# Patient Record
Sex: Male | Born: 1985 | Race: White | Hispanic: No | Marital: Single | Smoking: Never smoker
Health system: Southern US, Community
[De-identification: ages and names within clinical notes are randomized; demographics above are authoritative.]

---
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
## 2011-03-10 ENCOUNTER — Other Ambulatory Visit: Payer: Self-pay | Admitting: Emergency Medicine

## 2011-03-11 ENCOUNTER — Ambulatory Visit
Admission: RE | Admit: 2011-03-11 | Discharge: 2011-03-11 | Disposition: A | Discharge status: Home / Self Care | Payer: BC Managed Care – PPO | Source: Ambulatory Visit | Attending: Emergency Medicine | Admitting: Emergency Medicine

## 2012-10-03 ENCOUNTER — Ambulatory Visit (INDEPENDENT_AMBULATORY_CARE_PROVIDER_SITE_OTHER): Payer: BC Managed Care – PPO | Admitting: Emergency Medicine

## 2012-10-03 VITALS — BP 100/64 | HR 73 | Temp 99.1°F | Resp 12 | Ht 67.5 in | Wt 152.0 lb

## 2012-10-03 DIAGNOSIS — R509 Fever, unspecified: Secondary | ICD-10-CM

## 2012-10-03 DIAGNOSIS — R059 Cough, unspecified: Secondary | ICD-10-CM

## 2012-10-03 DIAGNOSIS — J020 Streptococcal pharyngitis: Secondary | ICD-10-CM

## 2012-10-03 DIAGNOSIS — J029 Acute pharyngitis, unspecified: Secondary | ICD-10-CM

## 2012-10-03 DIAGNOSIS — R05 Cough: Secondary | ICD-10-CM

## 2012-10-03 LAB — POCT RAPID STREP A (OFFICE): Rapid Strep A Screen: NEGATIVE

## 2012-10-03 LAB — POCT INFLUENZA A/B
Influenza A, POC: NEGATIVE
Influenza B, POC: NEGATIVE

## 2012-10-03 MED ORDER — AZITHROMYCIN 250 MG PO TABS: ORAL_TABLET | ORAL | Status: DC

## 2012-10-03 MED ORDER — BENZONATATE 200 MG PO CAPS: 200.0000 mg | ORAL_CAPSULE | Freq: Two times a day (BID) | ORAL | Status: DC | PRN

## 2012-10-03 MED ORDER — ALBUTEROL SULFATE HFA 108 (90 BASE) MCG/ACT IN AERS: 2.0000 | INHALATION_SPRAY | RESPIRATORY_TRACT | Status: DC | PRN

## 2012-10-03 NOTE — Progress Notes (Signed)
  Subjective:    Patient ID: Jeffrey Malone, male    DOB: 06/07/1985, 27 y.o.   MRN: [id number]  HPI   27 yo male presents with sore throat x 2 weeks.   Pt states he went for a run and a throat tickle started.  Progressively throat became more sore.  Voice changes since Friday.  Productive cough with cloudy white product.       Review of Systems     Objective:   Physical Exam patient is alert cooperative in no distress. His neck is supple. TMs are clear. Nose is normal . Patient has had his tonsils removed. There is minimal redness. Chest exam is clear to auscultation and percussion. Results for orders placed in visit on 10/03/12  POCT RAPID STREP A (OFFICE)      Result Value Range   Rapid Strep A Screen Negative  Negative  POCT INFLUENZA A/B      Result Value Range   Influenza A, POC Negative     Influenza B, POC Negative           Assessment & Plan:  Patient placed on Z-Pak, Tessalon Perles, and albuterol inhaler if needed. recheck in 2-3 days if not improved

## 2012-10-03 NOTE — Patient Instructions (Signed)
Cough, Adult   A cough is a reflex that helps clear your throat and airways. It can help heal the body or may be a reaction to an irritated airway. A cough may only last 2 or 3 weeks (acute) or may last more than 8 weeks (chronic).   CAUSES  Acute cough:   Viral or bacterial infections.  Chronic cough:   Infections.   Allergies.   Asthma.   Post-nasal drip.   Smoking.   Heartburn or acid reflux.   Some medicines.   Chronic lung problems (COPD).   Cancer.  SYMPTOMS    Cough.   Fever.   Chest pain.   Increased breathing rate.   High-pitched whistling sound when breathing (wheezing).   Colored mucus that you cough up (sputum).  TREATMENT    A bacterial cough may be treated with antibiotic medicine.   A viral cough must run its course and will not respond to antibiotics.   Your caregiver may recommend other treatments if you have a chronic cough.  HOME CARE INSTRUCTIONS    Only take over-the-counter or prescription medicines for pain, discomfort, or fever as directed by your caregiver. Use cough suppressants only as directed by your caregiver.   Use a cold steam vaporizer or humidifier in your bedroom or home to help loosen secretions.   Sleep in a semi-upright position if your cough is worse at night.   Rest as needed.   Stop smoking if you smoke.  SEEK IMMEDIATE MEDICAL CARE IF:    You have pus in your sputum.   Your cough starts to worsen.   You cannot control your cough with suppressants and are losing sleep.   You begin coughing up blood.   You have difficulty breathing.   You develop pain which is getting worse or is uncontrolled with medicine.   You have a fever.  MAKE SURE YOU:    Understand these instructions.   Will watch your condition.   Will get help right away if you are not doing well or get worse.  Document Released: 11/15/2010 Document Revised: 08/11/2011 Document Reviewed: 11/15/2010  Exit[employer] Patient Information 2013 [employer]  Laryngitis  At the top of your  windpipe is your voice box. It is the source of your voice. Inside your voice box are 2 bands of muscles called vocal cords. When you breathe, your vocal cords are relaxed and open so that air can get into the lungs. When you decide to say something, these cords come together and vibrate. The sound from these vibrations goes into your throat and comes out through your mouth as sound.  Laryngitis is an inflammation of the vocal cords that causes hoarseness, cough, loss of voice, sore throat, and dry throat. Laryngitis can be temporary (acute) or long-term (chronic). Most cases of acute laryngitis improve with time.Chronic laryngitis lasts for more than 3 weeks.  CAUSES  Laryngitis can often be related to excessive smoking, talking, or yelling, as well as inhalation of toxic fumes and allergies. Acute laryngitis is usually caused by a viral infection, vocal strain, measles or mumps, or bacterial infections. Chronic laryngitis is usually caused by vocal cord strain, vocal cord injury, postnasal drip, growths on the vocal cords, or acid reflux.  SYMPTOMS    Cough.   Sore throat.   Dry throat.  RISK FACTORS   Respiratory infections.   Exposure to irritating substances, such as cigarette smoke, excessive amounts of alcohol, stomach acids, and workplace chemicals.   Voice trauma,   such as vocal cord injury from shouting or speaking too loud.  DIAGNOSIS   Your cargiver will perform a physical exam. During the physical exam, your caregiver will examine your throat. The most common sign of laryngitis is hoarseness. Laryngoscopy may be necessary to confirm the diagnosis of this condition. This procedure allows your caregiver to look into the larynx.  HOME CARE INSTRUCTIONS   Drink enough fluids to keep your urine clear or pale yellow.   Rest until you no longer have symptoms or as directed by your caregiver.   Breathe in moist air.   Take all medicine as directed by your caregiver.   Do not smoke.   Talk as little  as possible (this includes whispering).   Write on paper instead of talking until your voice is back to normal.   Follow up with your caregiver if your condition has not improved after 10 days.  SEEK MEDICAL CARE IF:    You have trouble breathing.   You cough up blood.   You have persistent fever.   You have increasing pain.   You have difficulty swallowing.  MAKE SURE YOU:   Understand these instructions.   Will watch your condition.   Will get help right away if you are not doing well or get worse.  Document Released: 05/19/2005 Document Revised: 08/11/2011 Document Reviewed: 07/25/2010  [employer] Patient Information 2013 [employer]

## 2012-11-19 ENCOUNTER — Ambulatory Visit (INDEPENDENT_AMBULATORY_CARE_PROVIDER_SITE_OTHER): Payer: BC Managed Care – PPO | Admitting: Internal Medicine

## 2012-11-19 VITALS — BP 120/78 | HR 86 | Temp 98.0°F | Resp 16 | Ht 67.0 in | Wt 156.0 lb

## 2012-11-19 DIAGNOSIS — R05 Cough: Secondary | ICD-10-CM

## 2012-11-19 DIAGNOSIS — J019 Acute sinusitis, unspecified: Secondary | ICD-10-CM

## 2012-11-19 MED ORDER — PREDNISONE 20 MG PO TABS: ORAL_TABLET | ORAL | Status: DC

## 2012-11-19 MED ORDER — AMOXICILLIN 500 MG PO CAPS: 1000.0000 mg | ORAL_CAPSULE | Freq: Two times a day (BID) | ORAL | Status: AC

## 2012-11-19 NOTE — Progress Notes (Signed)
  Subjective:    Patient ID: Jeffrey Malone, male    DOB: 12-08-1985, 27 y.o.   MRN: [id number]  HPI 27 YO Male patient comes in today with the following complaints started with congestion and cough about two weeks ago. Cough is affecting sleep, comes with rest or activity. Nose has been congested. Throat has been dry and irritated. No fever. 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 predniSONE (DELTASONE) 20 MG tablet    Sig: 3/3/2/2/1/1 single daily dose for 6 days    Dispense:  12 tablet    Refill:  0  . amoxicillin (AMOXIL) 500 MG capsule    Sig: Take 2 capsules (1,000 mg total) by mouth 2 (two) times daily.    Dispense:  40 capsule    Refill:  0

## 2012-12-06 ENCOUNTER — Encounter: Payer: Self-pay | Admitting: Family Medicine

## 2012-12-06 ENCOUNTER — Ambulatory Visit (INDEPENDENT_AMBULATORY_CARE_PROVIDER_SITE_OTHER): Payer: BC Managed Care – PPO | Admitting: Family Medicine

## 2012-12-06 VITALS — BP 118/72 | HR 60 | Temp 98.1°F | Resp 16 | Ht 67.5 in | Wt 153.2 lb

## 2012-12-06 DIAGNOSIS — K130 Diseases of lips: Secondary | ICD-10-CM

## 2012-12-06 MED ORDER — CLOTRIMAZOLE 1 % EX CREA: TOPICAL_CREAM | Freq: Two times a day (BID) | CUTANEOUS | Status: DC

## 2012-12-06 NOTE — Patient Instructions (Signed)
Apply the antifungal cream twice per day for next 2 weeks. Apply vaseline to area at other times. Return to the clinic or go to the nearest emergency room if any of your symptoms worsen or new symptoms occur.

## 2012-12-06 NOTE — Progress Notes (Signed)
  Subjective:    Patient ID: Jeffrey Malone, male    DOB: [birth year], 27 y.o.   MRN: [id number]  HPI Jeffrey Malone is a 27 y.o. male  "[name]" Treated with prednisone taper and amox for sinus infection 11/19/12.  Had been on Zpak for bronchitis/cough 10/03/12. Feels better in regards to cough.congestion and sinus symptoms last week, but now with rash in corners of mouth. No fever. Hx of cold sores, but usually on inside or bottom of lip.   Current rash started 3-4 days ago. Itchy. No pain, no mouth sores.  Wears retainers.  Has tried to apply Abbreva past few days.   Review of Systems  Constitutional: Negative for fever and chills.  HENT: Negative for sore throat, mouth sores, trouble swallowing and voice change.   Respiratory: Negative for cough and shortness of breath.        Objective:   Physical Exam  Vitals reviewed. Constitutional: He is oriented to person, place, and time. He appears well-developed and well-nourished.  HENT:  Head: Normocephalic and atraumatic.  Right Ear: Tympanic membrane, external ear and ear canal normal.  Left Ear: Tympanic membrane, external ear and ear canal normal.  Nose: No rhinorrhea.  Mouth/Throat: Uvula is midline, oropharynx is clear and moist and mucous membranes are normal. No oropharyngeal exudate or posterior oropharyngeal erythema.    Eyes: Conjunctivae are normal. Pupils are equal, round, and reactive to light.  Neck: Neck supple.  Cardiovascular: Normal rate, regular rhythm, normal heart sounds and intact distal pulses.   No murmur heard. Pulmonary/Chest: Effort normal and breath sounds normal. He has no wheezes. He has no rhonchi. He has no rales.  Abdominal: Soft. There is no tenderness.  Lymphadenopathy:    He has no cervical adenopathy.  Neurological: He is alert and oriented to person, place, and time.  Skin: Skin is warm and dry. No rash noted.  Psychiatric: He has a normal mood and affect. His behavior is normal.     Results for orders placed in visit on 12/06/12  POCT SKIN KOH      Result Value Range   Skin KOH, POC Negative         Assessment & Plan:  Jeffrey Malone is a 27 y.o. male Angular cheilitis - Plan: POCT Skin KOH, clotrimazole (LOTRIMIN) 1 % cream  Possible irritant cheilitis, but with recent abx - possible fungal component. Start clotrimazole, vaseline as barrier, rtc precautions. Does not appear bacterial at this point, but rtc precautions discussed.   Meds ordered this encounter  Medications  . clotrimazole (LOTRIMIN) 1 % cream    Sig: Apply topically 2 (two) times daily.    Dispense:  30 g    Refill:  0   Patient Instructions  Apply the antifungal cream twice per day for next 2 weeks. Apply vaseline to area at other times. Return to the clinic or go to the nearest emergency room if any of your symptoms worsen or new symptoms occur.

## 2013-01-21 IMAGING — CT CT ABD-PELV W/O CM
2 of 4 series · 17 of 46 positions shown, 19 images · non-contrast
Comparison: None.

CLINICAL DATA: Gross hematuria.

CT ABDOMEN AND PELVIS WITHOUT CONTRAST
TECHNIQUE: Multidetector CT imaging of the abdomen and pelvis was
performed following the standard protocol without intravenous
contrast.

[Series 2: renal stone w/o · axial · non-contrast · 0.70mm/px · z∈[-408,+2]mm · 14 of 87 slices shown, 16 images]
[im 4/87  soft-tissue]
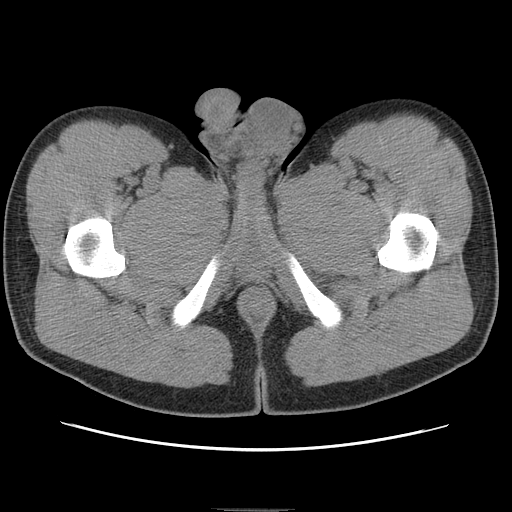
[im 4/87  bone]
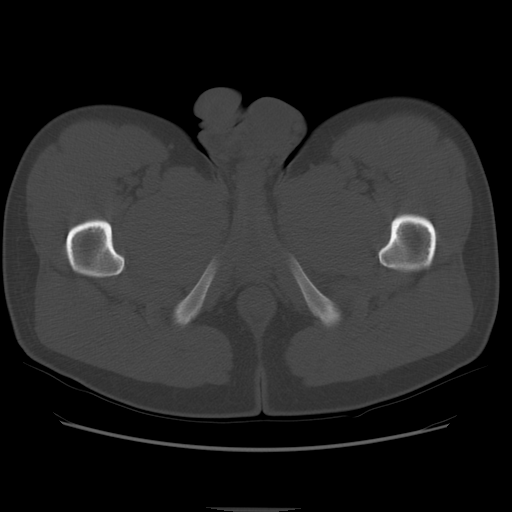
[im 11/87  soft-tissue]
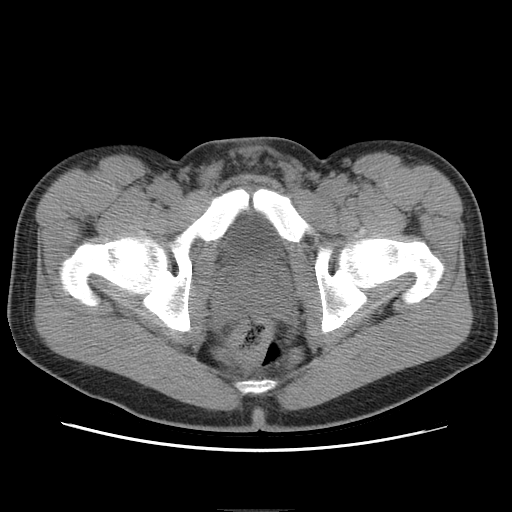
[im 18/87  soft-tissue]
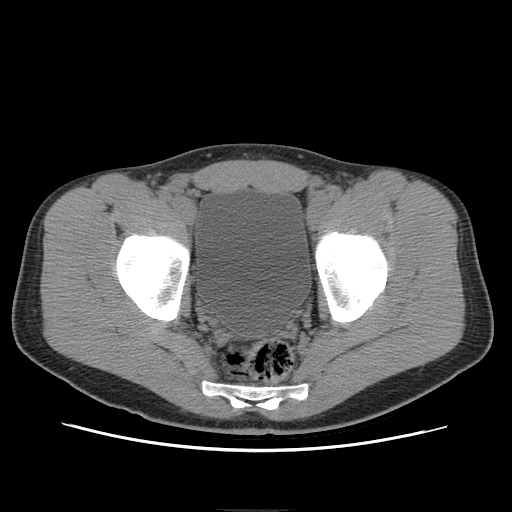
[im 22/87  soft-tissue]
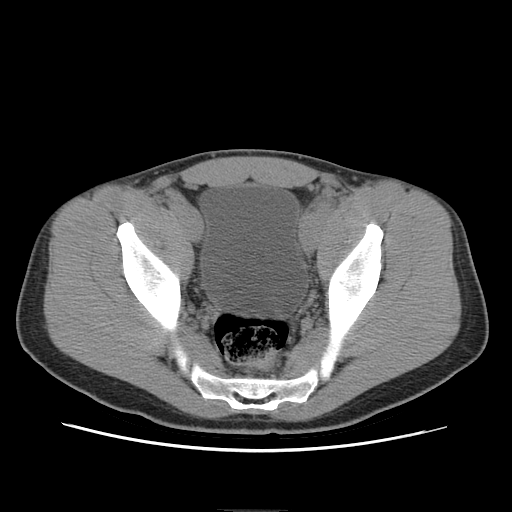
[im 29/87  soft-tissue]
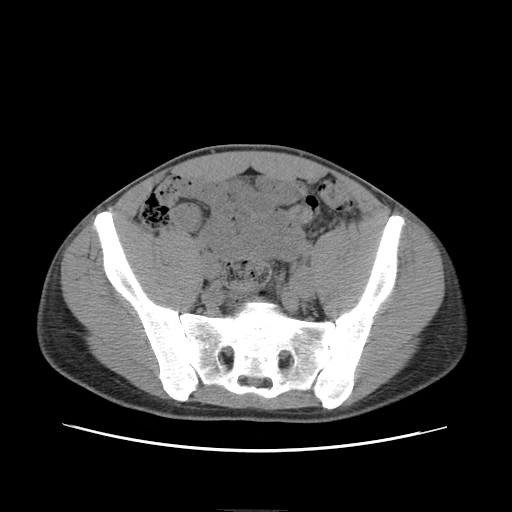
[im 36/87  soft-tissue]
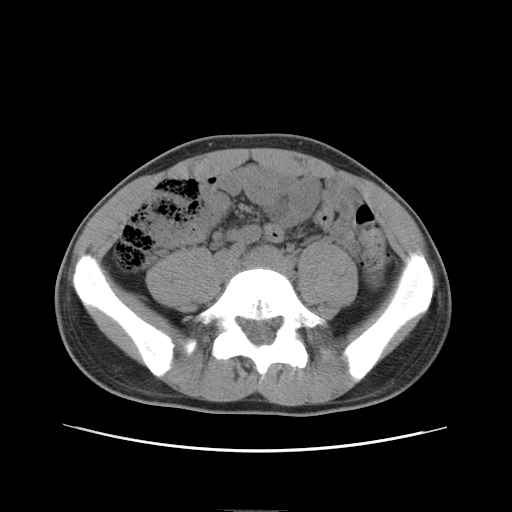
[im 40/87  soft-tissue]
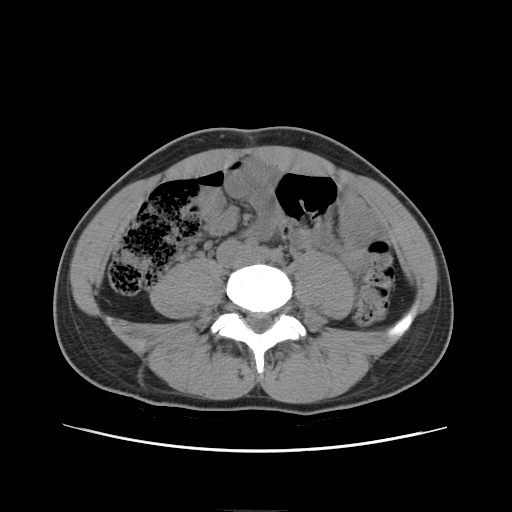
[im 47/87  soft-tissue]
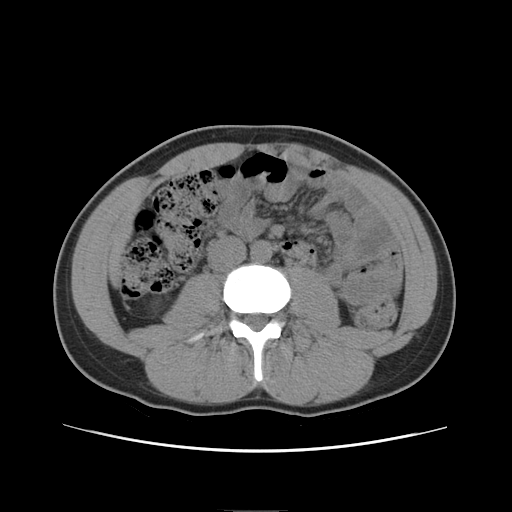
[im 51/87  soft-tissue]
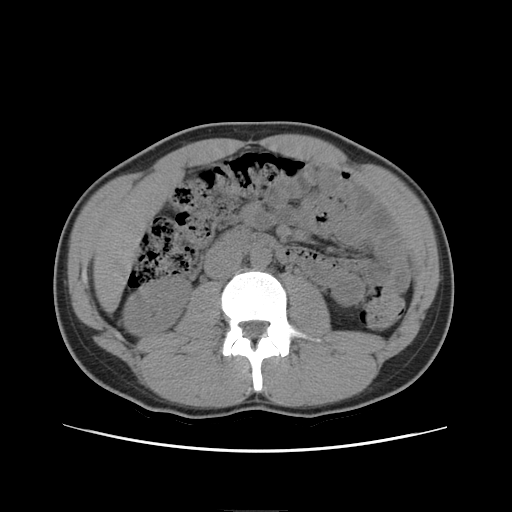
[im 51/87  bone]
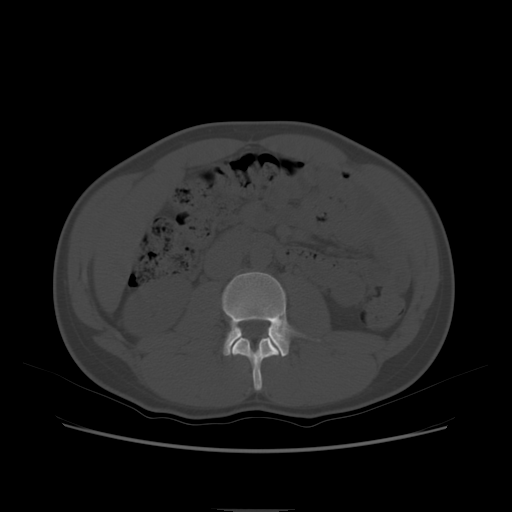
[im 58/87  soft-tissue]
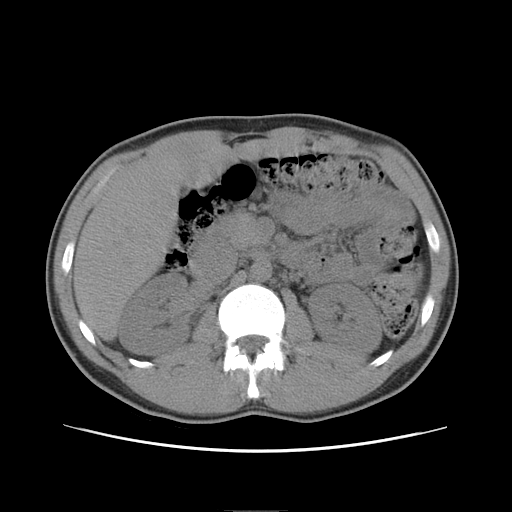
[im 65/87  soft-tissue]
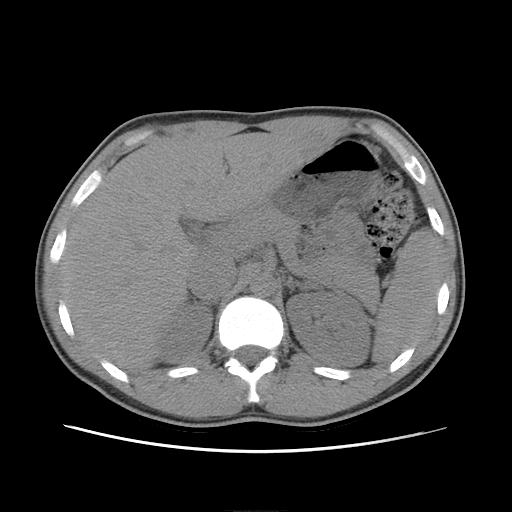
[im 69/87  soft-tissue]
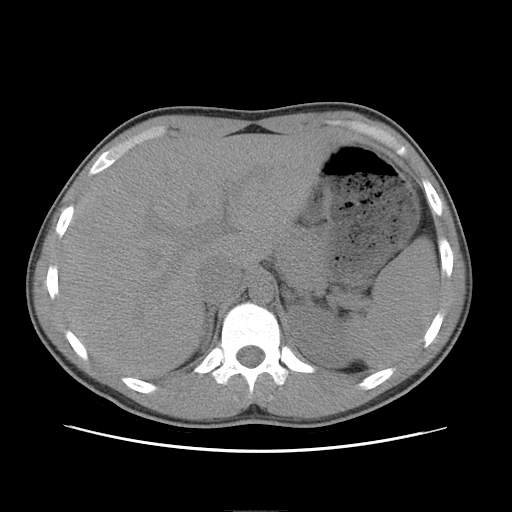
[im 76/87  soft-tissue]
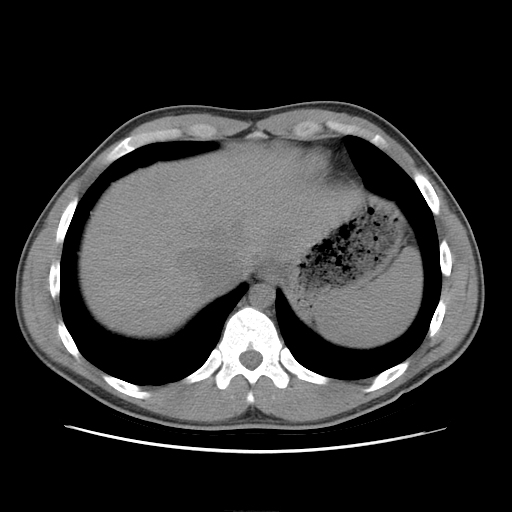
[im 83/87  soft-tissue]
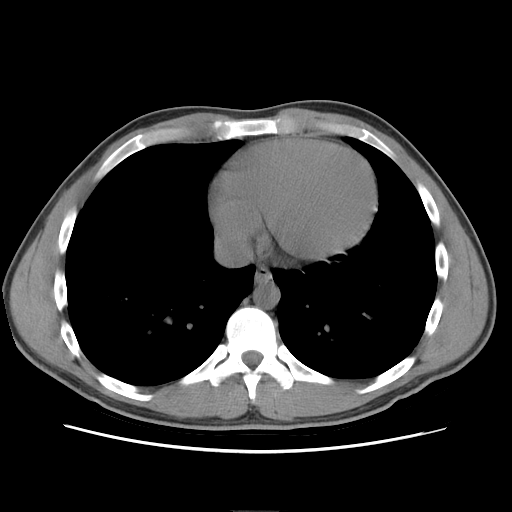

[Series 400: coronal · coronal · 0.92mm/px · 3 of 100 slices shown]
[im 34/100  soft-tissue]
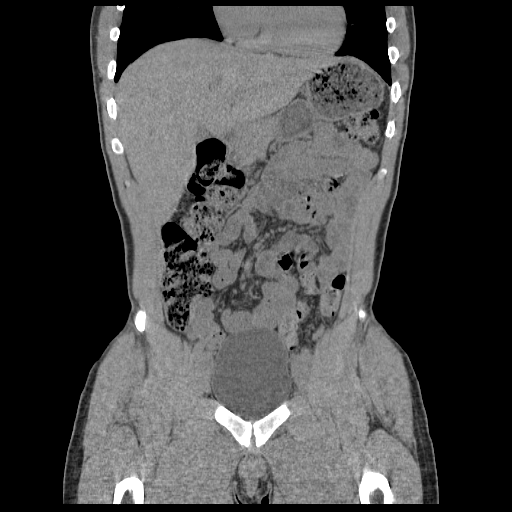
[im 45/100  soft-tissue]
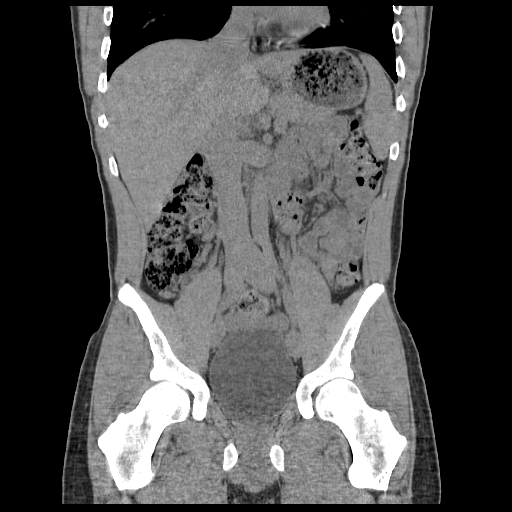
[im 56/100  soft-tissue]
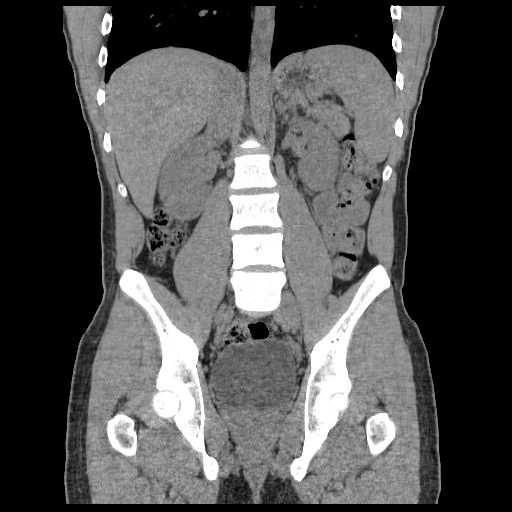

[17 of 46 positions shown; findings below may reference images not displayed]

FINDINGS: Lung bases are clear.  No effusions.  Heart is normal
size.

Liver, spleen, pancreas, adrenals and kidneys have an unremarkable
unenhanced appearance.  No renal or ureteral stones.  Urinary
bladder is unremarkable.

Bowel grossly unremarkable.  No free fluid, free air, or
adenopathy.  Aorta is normal caliber.  Appendix is not definitively
seen.  No inflammatory process in the right lower quadrant.
IMPRESSION: No renal or ureteral stones.  No hydronephrosis.  No abnormality
seen on this unenhanced study.

## 2014-05-23 ENCOUNTER — Ambulatory Visit (INDEPENDENT_AMBULATORY_CARE_PROVIDER_SITE_OTHER): Payer: BC Managed Care – PPO | Admitting: Physician Assistant

## 2014-05-23 VITALS — BP 110/70 | HR 52 | Temp 98.4°F | Resp 18 | Ht 68.5 in | Wt 156.0 lb

## 2014-05-23 DIAGNOSIS — Z111 Encounter for screening for respiratory tuberculosis: Secondary | ICD-10-CM

## 2014-05-23 NOTE — Progress Notes (Signed)
  Tuberculosis Risk Questionnaire  1. No Were you born outside the [place] in one of the following parts of the world: [place], [place], [place], [place] or [place]?    2. No Have you traveled outside the [place] and lived for more than one month in one of the following parts of the world: [place], [place], [place], [place] or [place]?    3. No Do you have a compromised immune system such as from any of the following conditions:HIV/AIDS, organ or bone marrow transplantation, diabetes, immunosuppressive medicines (e.g. Prednisone, Remicaide), leukemia, lymphoma, cancer of the head or neck, gastrectomy or jejunal bypass, end-stage renal disease (on dialysis), or silicosis?     4. Yes (works in Hospital) Have you ever or do you plan on working in: a residential care center, a health care facility, a jail or prison or homeless shelter?    5. No Have you ever: injected illegal drugs, used crack cocaine, lived in a homeless shelter  or been in jail or prison?     6. No Have you ever been exposed to anyone with infectious tuberculosis?    Tuberculosis Symptom Questionnaire  Do you currently have any of the following symptoms?  1. No Unexplained cough lasting more than 3 weeks?   2. No Unexplained fever lasting more than 3 weeks.   3. No Night Sweats (sweating that leaves the bedclothes and sheets wet)     4. No Shortness of Breath   5. No Chest Pain   6. No Unintentional weight loss    7. No Unexplained fatigue (very tired for no reason)

## 2014-05-23 NOTE — Progress Notes (Signed)
    MRN: [id number] DOB: 09/29/1985  Subjective:   Jeffrey Malone is a 28 y.o. male presenting for PPD test.    He is seeking PPD test for work an hour away.  He is here for 3 daysuntil she will   Jeffrey RuizJohn currently has no medications in their medication list.  He is allergic to codeine.  Inmer  has no past medical history on file. Also  has no past surgical history on file.  ROS As in subjective.  Objective:   Vitals: BP 110/70 mmHg  Pulse 52  Temp(Src) 98.4 F (36.9 C) (Oral)  Resp 18  Ht 5' 8.5" (1.74 m)  Wt 156 lb (70.761 kg)  BMI 23.37 kg/m2  SpO2 99%  Physical Exam  No results found for this or any previous visit (from the past 24 hour(s)).   Assessment and Plan :  28 year old is here for PPD for work.  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  He will not be able return within the scheduled time frame reading, nor will the clinic here be open during his time frame, so we did not proceed with subdermal injection.  Encounter for PPD test  Trena PlattStephanie Sheniece Ruggles, PA-C [facility] 12/23/201511:20 AM
